# Patient Record
Sex: Female | Born: 1980 | Race: White | Hispanic: No | Marital: Single | Smoking: Former smoker
Health system: Southern US, Community
[De-identification: ages and names within clinical notes are randomized; demographics above are authoritative.]

## PROBLEM LIST (undated history)

## (undated) DIAGNOSIS — N946 Dysmenorrhea, unspecified: Secondary | ICD-10-CM

---

## 2006-08-20 ENCOUNTER — Emergency Department (HOSPITAL_COMMUNITY): Admission: EM | Admit: 2006-08-20 | Discharge: 2006-08-20 | Payer: Self-pay | Admitting: Family Medicine

## 2007-09-14 ENCOUNTER — Emergency Department (HOSPITAL_COMMUNITY): Admission: EM | Admit: 2007-09-14 | Discharge: 2007-09-14 | Payer: Self-pay | Admitting: Family Medicine

## 2008-03-07 ENCOUNTER — Emergency Department (HOSPITAL_COMMUNITY): Admission: EM | Admit: 2008-03-07 | Discharge: 2008-03-07 | Payer: Self-pay | Admitting: Emergency Medicine

## 2008-11-29 ENCOUNTER — Encounter (INDEPENDENT_AMBULATORY_CARE_PROVIDER_SITE_OTHER): Payer: Self-pay | Admitting: General Surgery

## 2008-11-29 ENCOUNTER — Ambulatory Visit (HOSPITAL_COMMUNITY): Admission: RE | Admit: 2008-11-29 | Discharge: 2008-11-29 | Payer: Self-pay | Admitting: General Surgery

## 2010-06-21 IMAGING — RF DG CHOLANGIOGRAM OPERATIVE
1 series · 4 of 4 positions shown · non-contrast
Comparison: None

CLINICAL DATA: Gallstones

INTRAOPERATIVE CHOLANGIOGRAM
TECHNIQUE: Multiple fluoroscopic spot radiographs were obtained
during intraoperative cholangiogram and are submitted for
interpretation post-operatively.
Fluoroscopy Time: 0.2 minutes

[Series 1: run · 4 of 28 frames shown]
[frame 1/28]
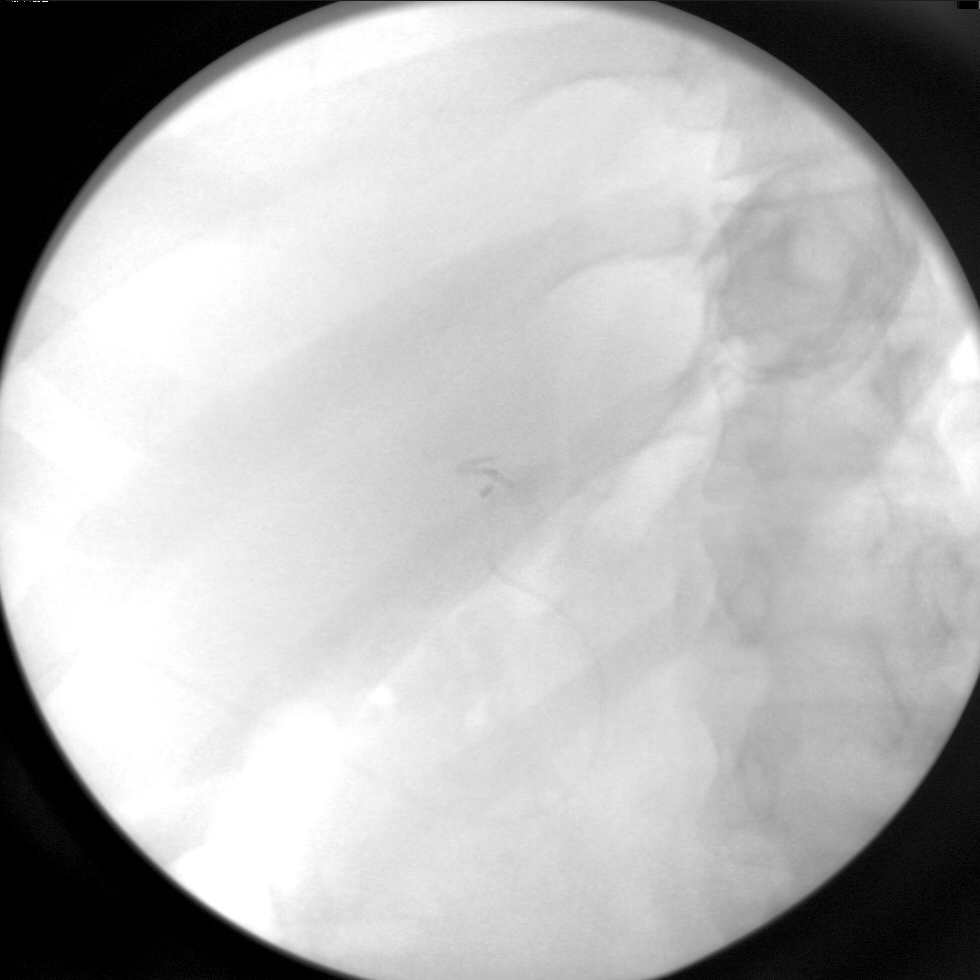
[frame 5/28]
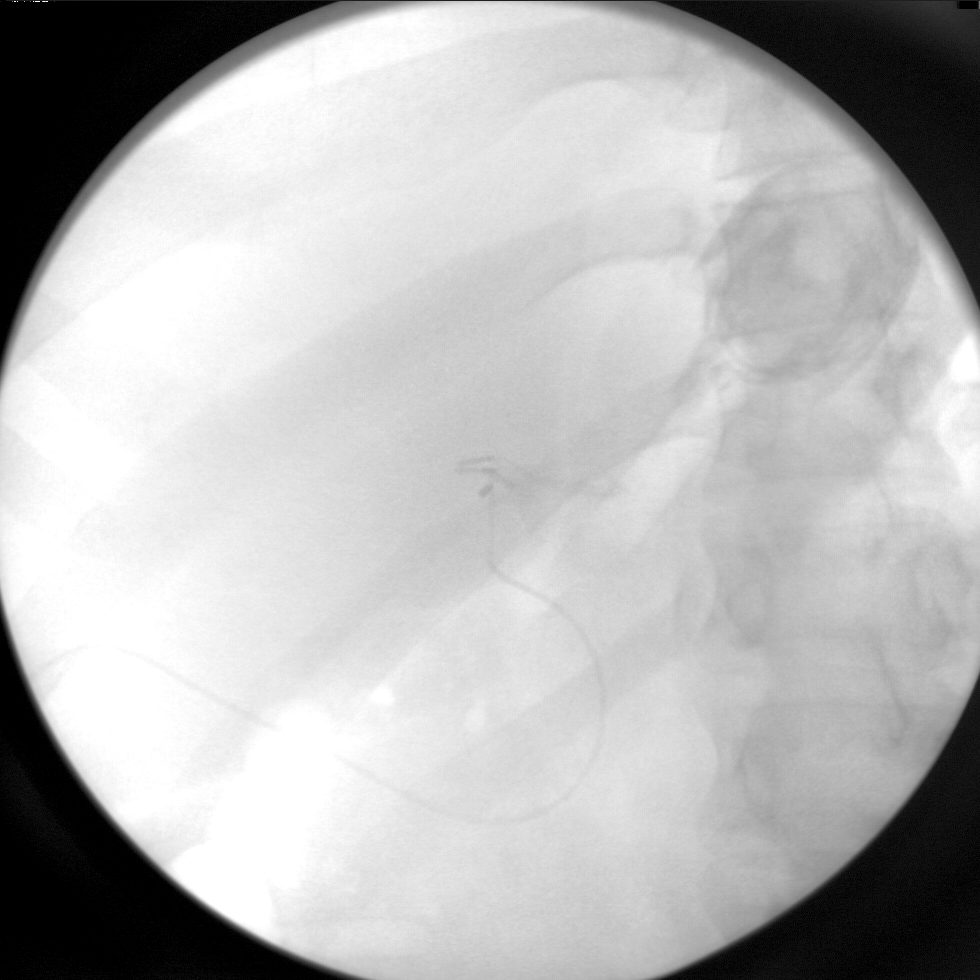
[frame 15/28]
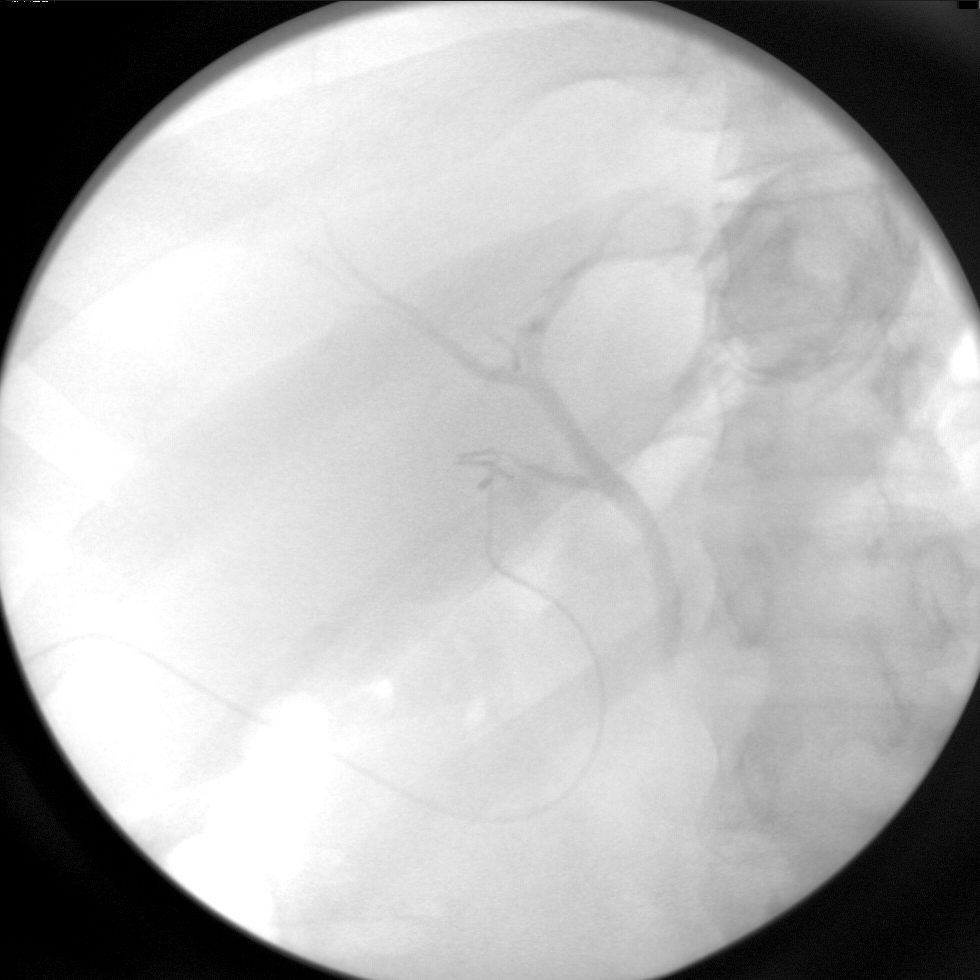
[frame 24/28]
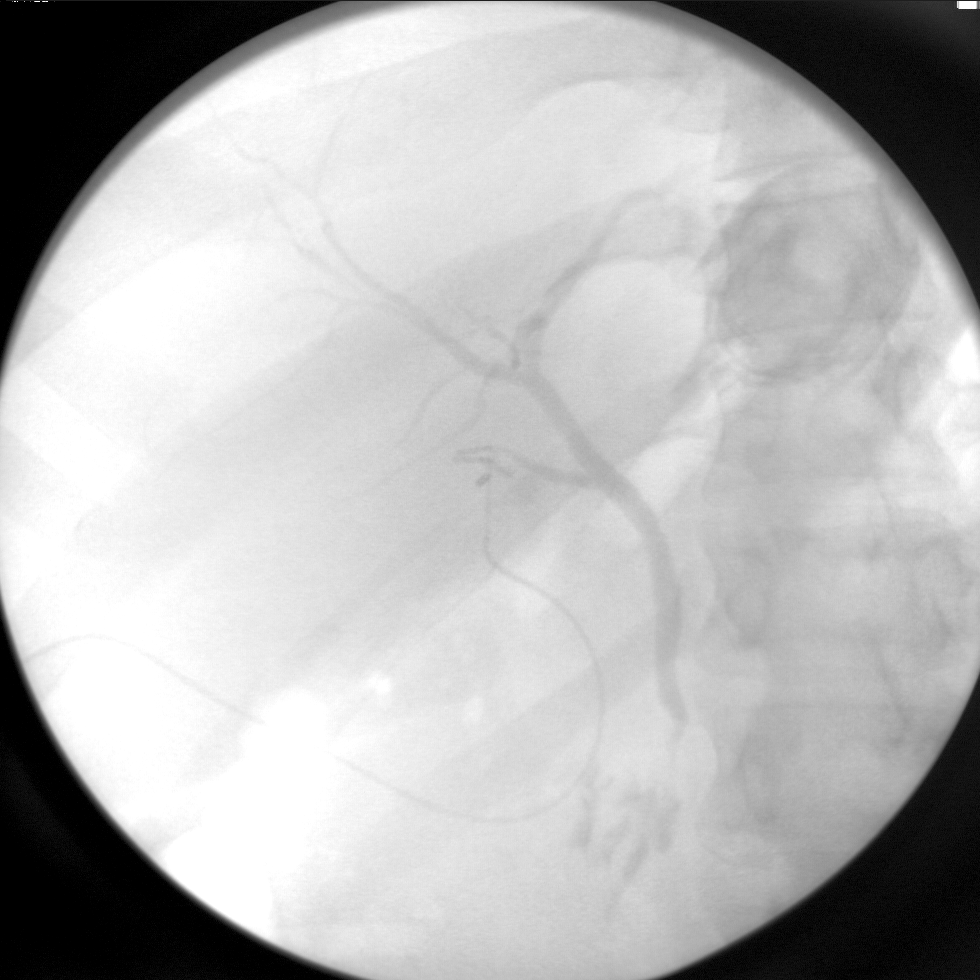

[4 of 4 positions shown; findings below may reference images not displayed]

FINDINGS: Contrast injection of the cystic duct.  No biliary ductal
stricture, dilatation, or filling defects to suggest calculi.
Contrast passes into the duodenum.
IMPRESSION: Negative operative cholangiogram.

## 2010-07-29 HISTORY — PX: LAPAROSCOPIC CHOLECYSTECTOMY: SUR755

## 2010-11-06 LAB — DIFFERENTIAL
Eosinophils Relative: 1 % (ref 0–5)
Lymphocytes Relative: 34 % (ref 12–46)
Lymphs Abs: 2.6 10*3/uL (ref 0.7–4.0)
Monocytes Absolute: 0.3 10*3/uL (ref 0.1–1.0)
Monocytes Relative: 4 % (ref 3–12)

## 2010-11-06 LAB — CBC
MCHC: 34.4 g/dL (ref 30.0–36.0)
MCV: 89.8 fL (ref 78.0–100.0)
Platelets: 224 10*3/uL (ref 150–400)
WBC: 7.7 10*3/uL (ref 4.0–10.5)

## 2010-11-06 LAB — COMPREHENSIVE METABOLIC PANEL
ALT: 23 U/L (ref 0–35)
AST: 14 U/L (ref 0–37)
Albumin: 3.8 g/dL (ref 3.5–5.2)
Calcium: 9.2 mg/dL (ref 8.4–10.5)
Creatinine, Ser: 0.69 mg/dL (ref 0.4–1.2)
GFR calc Af Amer: >60 mL/min (ref >60)
Sodium: 140 mEq/L (ref 135–145)
Total Protein: 6.7 g/dL (ref 6.0–8.3)

## 2010-11-06 LAB — PREGNANCY, URINE: Preg Test, Ur: NEGATIVE

## 2010-12-11 NOTE — Op Note (Signed)
[name], [name]               ACCOUNT NO.:  [id number]   MEDICAL RECORD NO.:  [id number]          PATIENT TYPE:  AMB   LOCATION:  DAY                          FACILITY:  [facility]   PHYSICIAN:  Anselm Pancoast. Weatherly, M.D.DATE OF BIRTH:  03-13-1981   DATE OF PROCEDURE:  11/29/2008  DATE OF DISCHARGE:                               OPERATIVE REPORT   PREOPERATIVE DIAGNOSIS:  Chronic cholecystitis with stones.   POSTOPERATIVE DIAGNOSIS:  Chronic cholecystitis with stones, subacute  gallbladder.   OPERATION:  Laparoscopic cholecystectomy with cholangiogram.   SURGEON:  Anselm Pancoast. Zachery Dakins, M.D.   ASSISTANT:  Adolph Pollack, M.D.   ANESTHESIA:  General anesthesia.   HISTORY:  [name] is a 30 year old Caucasian female who has been  trying to diet for weight control.  Weighs probably about 265 pounds and  she has been as heavy as 275.  I saw her in the office last week where  she had been referred by the [facility] physicians.  Elvera Lennox had  actually seen her.  She had had episodes of epigastric pain, sometimes  radiating to the back, and her mother has had gallstones within the last  year.  They did an ultrasound that showed stones throughout the  gallbladder.  They did not repeat laboratory studies, and she was  referred to our office and stated she had pain probably weeks before and  was not acutely tender but wanted to proceed on promptly with surgery.  She has got small stones, and her pain was certainly consistent with  passage of common duct stones and I was agreement with proceeding on as  soon as possible.  Her ultrasound had showed the common bile duct to be  only about 2 mm in size.  On her liver function studies today, liver  tests were normal.  Her white count and hemoglobin were normal also.  Preoperatively she was given Unison, not being allergic to penicillin  and has PAS stockings.   She was taken back to the operative suite.  Time out completed.  The  abdomen had been prepped with Betadine solution and draped before the  time out.  A small incision below the umbilicus was made using the deep  ends of the [employer] to identify the fascia.  A small little vertical  incision was made.  The two edges were picked up with Kochers and a  little opening made into the peritoneum and a Kelly introduced.  The  pursestring suture of 0 Vicryl was placed and the Hasson cannula  introduced.  She has got a subacutely inflamed gallbladder.  The upper  10-mm trocar was placed under direct vision.  It was anesthetized in the  fascia, and Dr. Abbey Chatters placed two lateral 5-mm trocars at the  appropriate position.  There were adhesions to the gallbladder.  These  were kind of carefully taken down.  It appears that they are fairly  recent.  Then the proximal portion of the gallbladder peritoneum was  opened.  You could see the cystic duct, and at is slightly prominent.  You could feel a little stone within  the cystic duct.  I placed a clip  on the junction of the cystic duct/gallbladder.  I made a little  incision right over the impacted stone.  You could milk it back into the  opening and then we could get good flow of bile.  I tried to put the  [facility] catheter in.  It appeared to leak.  I repositioned and inflated  down a little deeper and then held it in place with a clip.  The x-ray  was obtained.  Showed prompt filling of the extrahepatic biliary system,  good flow into the duodenum.  No evidence of a complex stone.  The  catheter was removed.  I went just a little bit more proximal on the  cystic duct, triply clipped it and divided it, and then the peritoneum  was kind of carefully opened up.  The anterior branch of the cystic  artery was doubly clipped proximally, singly distally and divided, and  then the posterior branch of the artery was double clipped also.  You  could see the little hepatic artery and we kind of dissected anterior,  kind of close  to the gallbladder wall and then freed up the gallbladder  completely.  There was a little bit of white bile spillage on the most  distal portion that was kind of adherent to the gallbladder fossa, and  then after the gallbladder was freed, the spatula electrocautery was  used for the little bit of hemostasis.  The gallbladder had been placed  into an EndoCatch bag and reinspected.  No evidence of any bleeding, and  then we switched the camera to the upper 10-mm port, grabbed the bag and  pulled it out the umbilicus.  The cystic duct clip was in good position.  There was no evidence of any bleeding, and then we had removed the  irrigating fluid and placed an extra pursestring suture at the fascia at  the umbilicus.  Marcaine was placed in the fascia of the umbilicus.  Subcutaneous wounds were closed with 4-0 Monocryl, benzoin and Steri-  Strips on the skin.  The patient wants to be discharged after the  recovery room, and she should do fine.      Anselm Pancoast. Zachery Dakins, M.D.  Electronically Signed     WJW/MEDQ  D:  11/29/2008  T:  11/29/2008  Job:  [id number]   cc:   Holley Bouche, M.D.  Fax: [phone]

## 2011-04-19 LAB — POCT RAPID STREP A: Streptococcus, Group A Screen (Direct): POSITIVE — AB

## 2011-04-26 LAB — COMPREHENSIVE METABOLIC PANEL
ALT: 13
Alkaline Phosphatase: 50
CO2: 25
Chloride: 107
Glucose, Bld: 94
Potassium: 3.8
Sodium: 138
Total Bilirubin: 0.4
Total Protein: 6.5

## 2011-04-26 LAB — D-DIMER, QUANTITATIVE: D-Dimer, Quant: <0.22

## 2021-07-29 DIAGNOSIS — I10 Essential (primary) hypertension: Secondary | ICD-10-CM

## 2021-07-29 DIAGNOSIS — D649 Anemia, unspecified: Secondary | ICD-10-CM

## 2021-07-29 HISTORY — DX: Anemia, unspecified: D64.9

## 2021-07-29 HISTORY — DX: Essential (primary) hypertension: I10

## 2021-11-19 ENCOUNTER — Other Ambulatory Visit: Payer: Self-pay | Admitting: Family Medicine

## 2021-11-19 DIAGNOSIS — N946 Dysmenorrhea, unspecified: Secondary | ICD-10-CM

## 2021-12-04 ENCOUNTER — Ambulatory Visit
Admission: RE | Admit: 2021-12-04 | Discharge: 2021-12-04 | Disposition: A | Discharge status: Home / Self Care | Payer: Managed Care, Other (non HMO) | Source: Ambulatory Visit | Attending: Family Medicine | Admitting: Family Medicine

## 2021-12-04 DIAGNOSIS — N946 Dysmenorrhea, unspecified: Secondary | ICD-10-CM

## 2022-04-22 ENCOUNTER — Encounter (HOSPITAL_BASED_OUTPATIENT_CLINIC_OR_DEPARTMENT_OTHER): Payer: Self-pay | Admitting: Obstetrics and Gynecology

## 2022-04-22 ENCOUNTER — Other Ambulatory Visit: Payer: Self-pay

## 2022-04-22 NOTE — Progress Notes (Signed)
Spoke w/ via phone for pre-op interview---[facility] Lab needs dos----urine pregnancy               Lab results------05/10/22 lab appt for cbc, bmp, type & screen, EKG COVID test -----patient states asymptomatic no test needed Arrive at -------0530 on Monday, 05/13/22 NPO after MN NO Solid Food.  Clear liquids from MN until---0430 Med rec completed Medications to take morning of surgery -----Alonna Buckler (BCP) Diabetic medication -----n/a Patient instructed no nail polish to be worn day of surgery Patient instructed to bring photo id and insurance card day of surgery Patient aware to have Driver (ride ) / caregiver    for 24 hours after surgery - Maurine Minister or Cher Nakai Patient Special Instructions -----Extended / overnight stay instructions given. Pre-Op special Istructions -----Requested orders from Dr. Gaetano Net via Epic IB on 04/22/22 Patient verbalized understanding of instructions that were given at this phone interview. Patient denies shortness of breath, chest pain, fever, cough at this phone interview.

## 2022-04-22 NOTE — Progress Notes (Signed)
Your procedure is scheduled on Monday, 05/13/22.  Report to [facility].   Call this number if you have problems the morning of surgery  :[phone].   OUR ADDRESS IS [address].  WE ARE LOCATED IN [facility].  PLEASE BRING YOUR INSURANCE CARD AND PHOTO ID DAY OF SURGERY.  ONLY 2 PEOPLE ARE ALLOWED IN  WAITING  ROOM.                                      REMEMBER:  DO NOT EAT FOOD, CANDY GUM OR MINTS  AFTER MIDNIGHT THE NIGHT BEFORE YOUR SURGERY . YOU MAY HAVE CLEAR LIQUIDS FROM MIDNIGHT THE NIGHT BEFORE YOUR SURGERY UNTIL  4:30 AM. NO CLEAR LIQUIDS AFTER   4:30 AM DAY OF SURGERY.  YOU MAY  BRUSH YOUR TEETH MORNING OF SURGERY AND RINSE YOUR MOUTH OUT, NO CHEWING GUM CANDY OR MINTS.     CLEAR LIQUID DIET   Foods Allowed                                                                     Foods Excluded  Coffee and tea, regular and decaf                             liquids that you cannot  Plain Jell-O                                                                   see through such as: Fruit ices (not with fruit pulp)                                     milk, soups, orange juice  Plain  Popsicles                                    All solid food Carbonated beverages, regular and diet                                    Cranberry, grape and apple juices Sports drinks like Gatorade _____________________________________________________________________     TAKE THESE MEDICATIONS MORNING OF SURGERY: Incassia (birth control pill)    UP TO 4 VISITORS  MAY VISIT IN THE EXTENDED RECOVERY ROOM UNTIL 800 PM ONLY.  ONE  VISITOR AGE 51 AND OVER MAY SPEND THE NIGHT AND MUST BE IN EXTENDED RECOVERY ROOM NO LATER THAN 800 PM . YOUR DISCHARGE TIME AFTER YOU SPEND THE NIGHT IS 900 AM THE MORNING AFTER YOUR SURGERY.  YOU MAY PACK A SMALL OVERNIGHT BAG WITH TOILETRIES FOR YOUR  OVERNIGHT STAY IF YOU WISH.  YOUR PRESCRIPTION  MEDICATIONS WILL BE PROVIDED DURING [place].                                      DO NOT WEAR JEWERLY, MAKE UP. DO NOT WEAR LOTIONS, POWDERS, PERFUMES OR NAIL POLISH ON YOUR FINGERNAILS. TOENAIL POLISH IS OK TO WEAR. DO NOT SHAVE FOR 48 HOURS PRIOR TO DAY OF SURGERY. MEN MAY SHAVE FACE AND NECK. CONTACTS, GLASSES, OR DENTURES MAY NOT BE WORN TO SURGERY.  REMEMBER: NO SMOKING, DRUGS OR ALCOHOL FOR 24 HOURS BEFORE YOUR SURGERY.                                    [facility] IS NOT RESPONSIBLE  FOR ANY BELONGINGS.                                                                    Marland Kitchen           [facility] - Preparing for Surgery Before surgery, you can play an important role.  Because skin is not sterile, your skin needs to be as free of germs as possible.  You can reduce the number of germs on your skin by washing with CHG (chlorahexidine gluconate) soap before surgery.  CHG is an antiseptic cleaner which kills germs and bonds with the skin to continue killing germs even after washing. Please DO NOT use if you have an allergy to CHG or antibacterial soaps.  If your skin becomes reddened/irritated stop using the CHG and inform your nurse when you arrive at Short Stay. Do not shave (including legs and underarms) for at least 48 hours prior to the first CHG shower.  You may shave your face/neck. Please follow these instructions carefully:  1.  Shower with CHG Soap the night before surgery and the  morning of Surgery.  2.  If you choose to wash your hair, wash your hair first as usual with your  normal  shampoo.  3.  After you shampoo, rinse your hair and body thoroughly to remove the  shampoo.                                        4.  Use CHG as you would any other liquid soap.  You can apply chg directly  to the skin and wash , chg soap provided, night before and morning of your surgery.  5.  Apply the CHG Soap to your body ONLY FROM THE NECK DOWN.   Do not use on face/ open                            Wound or open sores. Avoid contact with eyes, ears mouth and genitals (private parts).                       Wash face,  Genitals (private parts) with your normal soap.  6.  Wash thoroughly, paying special attention to the area where your surgery  will be performed.  7.  Thoroughly rinse your body with warm water from the neck down.  8.  DO NOT shower/wash with your normal soap after using and rinsing off  the CHG Soap.             9.  Pat yourself dry with a clean towel.            10.  Wear clean pajamas.            11.  Place clean sheets on your bed the night of your first shower and do not  sleep with pets. Day of Surgery : Do not apply any lotions/deodorants the morning of surgery.  Please wear clean clothes to the hospital/surgery center.  IF YOU HAVE ANY SKIN IRRITATION OR PROBLEMS WITH THE SURGICAL SOAP, PLEASE GET A BAR OF GOLD DIAL SOAP AND SHOWER THE NIGHT BEFORE YOUR SURGERY AND THE MORNING OF YOUR SURGERY. PLEASE LET THE NURSE KNOW MORNING OF YOUR SURGERY IF YOU HAD ANY PROBLEMS WITH THE SURGICAL SOAP.   ________________________________________________________________________                                                        QUESTIONS [facility] PRE OP NURSE PHONE [phone].

## 2022-05-03 NOTE — H&P (Signed)
[name] is an 41 y.o. female G2P1 with fibroids and heavy menstrual flows. Menses last 7-8 days and she cannot leave house for 2-3 days/month due to heavy flow, sometimes off of her feet. A trial of OCP did not help. U/S in office notes uterus 11.9 x 7.6 x 8.6 cm with at least 3 fibroids ranging 2.6 to 3.7 cm. Ovaries appear normal. Endometrial biopsy done.   Pertinent Gynecological History: Menses: flow is excessive with use of many pads or tampons on heaviest days Bleeding:  Contraception:  progestin only OCP DES exposure: denies Blood transfusions: none Sexually transmitted diseases: no past history Previous GYN Procedures:  Last mammogram: normal Date: unknown Last pap: normal Date: 2023 OB History: G2, P1   Menstrual History: Menarche age: unknown Patient's last menstrual period was 04/17/2022 (exact date).    Past Medical History:  Diagnosis Date   Anemia 2023   taking iron supplements   Dysmenorrhea    Hypertension 2023   Follows with Dr. Rachell Cipro, per pt LOV was in June or July of 2023    Past Surgical History:  Procedure Laterality Date   CESAREAN SECTION  2002   LAPAROSCOPIC CHOLECYSTECTOMY  2012    History reviewed. No pertinent family history.  Social History:  reports that she quit smoking about 2 years ago. Her smoking use included cigarettes. She has a 10.00 pack-year smoking history. She has never used smokeless tobacco. She reports current alcohol use. She reports that she does not use drugs.  Allergies: No Known Allergies  No medications prior to admission.    Review of Systems  Constitutional:  Negative for fever.    Height '5\' 5"'$  (1.651 m), weight 120.2 kg, last menstrual period 04/17/2022. Physical Exam Cardiovascular:     Rate and Rhythm: Normal rate.  Pulmonary:     Effort: Pulmonary effort is normal.     No results found for this or any previous visit (from the past 24 hour(s)).  No results found.  Assessment/Plan: 41 yo  with uterine fibroids and menorrhagia Total abdominal hysterectomy with bilateral salpingectomy discussed with patient. Will leave normal appearing ovaries Surgery and risks including infection, organ damage, bleeding/transfusion-HIV/Hep, DVT/PE, pneumonia, wound breakdown D/W patient. D/W possible wound vac due to Hx of wound breakdown after C/S.  Shon Millet II 05/03/2022, 12:47 PM

## 2022-05-10 ENCOUNTER — Encounter (HOSPITAL_COMMUNITY)
Admission: RE | Admit: 2022-05-10 | Discharge: 2022-05-10 | Disposition: A | Discharge status: Home / Self Care | Payer: Managed Care, Other (non HMO) | Source: Ambulatory Visit | Attending: Obstetrics and Gynecology

## 2022-05-10 DIAGNOSIS — Z01818 Encounter for other preprocedural examination: Secondary | ICD-10-CM | POA: Insufficient documentation

## 2022-05-10 LAB — CBC
HCT: 44.3 % (ref 36.0–46.0)
Hemoglobin: 14.4 g/dL (ref 12.0–15.0)
MCH: 30.4 pg (ref 26.0–34.0)
MCHC: 32.5 g/dL (ref 30.0–36.0)
MCV: 93.7 fL (ref 80.0–100.0)
Platelets: 268 10*3/uL (ref 150–400)
RBC: 4.73 MIL/uL (ref 3.87–5.11)
RDW: 13.2 % (ref 11.5–15.5)
WBC: 8.1 10*3/uL (ref 4.0–10.5)
nRBC: 0 % (ref 0.0–0.2)

## 2022-05-10 LAB — BASIC METABOLIC PANEL
Anion gap: 6 (ref 5–15)
BUN: 16 mg/dL (ref 6–20)
CO2: 24 mmol/L (ref 22–32)
Calcium: 9.1 mg/dL (ref 8.9–10.3)
Chloride: 107 mmol/L (ref 98–111)
Creatinine, Ser: 0.7 mg/dL (ref 0.44–1.00)
GFR, Estimated: >60 mL/min (ref >60)
Glucose, Bld: 108 mg/dL — ABNORMAL HIGH (ref 70–99)
Potassium: 3.9 mmol/L (ref 3.5–5.1)
Sodium: 137 mmol/L (ref 135–145)

## 2022-05-10 LAB — RPR: RPR Ser Ql: NONREACTIVE

## 2022-05-13 ENCOUNTER — Inpatient Hospital Stay (HOSPITAL_BASED_OUTPATIENT_CLINIC_OR_DEPARTMENT_OTHER)
Admission: AD | Admit: 2022-05-13 | Discharge: 2022-05-14 | DRG: 742 | Disposition: A | Discharge status: Home / Self Care | Payer: Managed Care, Other (non HMO) | Attending: Obstetrics and Gynecology | Admitting: Obstetrics and Gynecology

## 2022-05-13 ENCOUNTER — Observation Stay (HOSPITAL_BASED_OUTPATIENT_CLINIC_OR_DEPARTMENT_OTHER): Payer: Managed Care, Other (non HMO) | Admitting: Certified Registered Nurse Anesthetist

## 2022-05-13 ENCOUNTER — Encounter (HOSPITAL_BASED_OUTPATIENT_CLINIC_OR_DEPARTMENT_OTHER)
Admission: AD | Disposition: A | Discharge status: Home / Self Care | Payer: Self-pay | Source: Home / Self Care | Attending: Obstetrics and Gynecology

## 2022-05-13 ENCOUNTER — Encounter (HOSPITAL_BASED_OUTPATIENT_CLINIC_OR_DEPARTMENT_OTHER): Payer: Self-pay | Admitting: Obstetrics and Gynecology

## 2022-05-13 ENCOUNTER — Other Ambulatory Visit: Payer: Self-pay

## 2022-05-13 DIAGNOSIS — D251 Intramural leiomyoma of uterus: Secondary | ICD-10-CM | POA: Diagnosis present

## 2022-05-13 DIAGNOSIS — I10 Essential (primary) hypertension: Secondary | ICD-10-CM | POA: Diagnosis present

## 2022-05-13 DIAGNOSIS — Z6841 Body Mass Index (BMI) 40.0 and over, adult: Secondary | ICD-10-CM

## 2022-05-13 DIAGNOSIS — Z87891 Personal history of nicotine dependence: Secondary | ICD-10-CM | POA: Diagnosis not present

## 2022-05-13 DIAGNOSIS — N920 Excessive and frequent menstruation with regular cycle: Principal | ICD-10-CM | POA: Diagnosis present

## 2022-05-13 DIAGNOSIS — D219 Benign neoplasm of connective and other soft tissue, unspecified: Secondary | ICD-10-CM | POA: Diagnosis present

## 2022-05-13 DIAGNOSIS — Z01818 Encounter for other preprocedural examination: Principal | ICD-10-CM

## 2022-05-13 HISTORY — DX: Dysmenorrhea, unspecified: N94.6

## 2022-05-13 HISTORY — PX: HYSTERECTOMY ABDOMINAL WITH SALPINGECTOMY: SHX6725

## 2022-05-13 LAB — TYPE AND SCREEN
ABO/RH(D): O POS
Antibody Screen: NEGATIVE

## 2022-05-13 LAB — POCT PREGNANCY, URINE: Preg Test, Ur: NEGATIVE

## 2022-05-13 LAB — ABO/RH: ABO/RH(D): O POS

## 2022-05-13 SURGERY — HYSTERECTOMY, TOTAL, ABDOMINAL, WITH SALPINGECTOMY
Anesthesia: General | Site: Abdomen | Laterality: Bilateral

## 2022-05-13 MED ORDER — ACETAMINOPHEN 10 MG/ML IV SOLN
INTRAVENOUS | Status: DC | PRN
  Administered 2022-05-13: 1000 mg via INTRAVENOUS

## 2022-05-13 MED ORDER — OXYCODONE HCL 5 MG PO TABS
ORAL_TABLET | ORAL | Status: AC
  Filled 2022-05-13: qty 1

## 2022-05-13 MED ORDER — PROPOFOL 10 MG/ML IV BOLUS
INTRAVENOUS | Status: AC
  Filled 2022-05-13: qty 20

## 2022-05-13 MED ORDER — SENNA 8.6 MG PO TABS
1.0000 | ORAL_TABLET | Freq: Two times a day (BID) | ORAL | Status: DC
  Administered 2022-05-13: 8.6 mg via ORAL

## 2022-05-13 MED ORDER — KETAMINE HCL 50 MG/5ML IJ SOSY
PREFILLED_SYRINGE | INTRAMUSCULAR | Status: AC
  Filled 2022-05-13: qty 5

## 2022-05-13 MED ORDER — IBUPROFEN 200 MG PO TABS
ORAL_TABLET | ORAL | Status: AC
  Filled 2022-05-13: qty 3

## 2022-05-13 MED ORDER — LIDOCAINE HCL (PF) 2 % IJ SOLN
INTRAMUSCULAR | Status: DC | PRN
  Administered 2022-05-13: 1.5 mg/kg/h via INTRADERMAL

## 2022-05-13 MED ORDER — HYDROMORPHONE HCL 1 MG/ML IJ SOLN
INTRAMUSCULAR | Status: AC
  Filled 2022-05-13: qty 1

## 2022-05-13 MED ORDER — ACETAMINOPHEN 500 MG PO TABS
1000.0000 mg | ORAL_TABLET | Freq: Four times a day (QID) | ORAL | Status: DC
  Administered 2022-05-13 – 2022-05-14 (×3): 1000 mg via ORAL

## 2022-05-13 MED ORDER — MEPERIDINE HCL 25 MG/ML IJ SOLN: 6.2500 mg | INTRAMUSCULAR | Status: DC | PRN

## 2022-05-13 MED ORDER — OXYCODONE HCL 5 MG PO TABS
5.0000 mg | ORAL_TABLET | ORAL | Status: DC | PRN
  Administered 2022-05-13: 5 mg via ORAL
  Administered 2022-05-13: 10 mg via ORAL
  Administered 2022-05-13: 5 mg via ORAL
  Administered 2022-05-14: 10 mg via ORAL
  Administered 2022-05-14 (×2): 5 mg via ORAL

## 2022-05-13 MED ORDER — ROCURONIUM BROMIDE 10 MG/ML (PF) SYRINGE
PREFILLED_SYRINGE | INTRAVENOUS | Status: DC | PRN
  Administered 2022-05-13: 100 mg via INTRAVENOUS

## 2022-05-13 MED ORDER — ALUM & MAG HYDROXIDE-SIMETH 200-200-20 MG/5ML PO SUSP: 30.0000 mL | ORAL | Status: DC | PRN

## 2022-05-13 MED ORDER — SENNA 8.6 MG PO TABS
ORAL_TABLET | ORAL | Status: AC
  Filled 2022-05-13: qty 1

## 2022-05-13 MED ORDER — HYDROMORPHONE HCL 1 MG/ML IJ SOLN
INTRAMUSCULAR | Status: DC | PRN
  Administered 2022-05-13: 1 mg via INTRAVENOUS

## 2022-05-13 MED ORDER — POVIDONE-IODINE 10 % EX SWAB: 2.0000 | Freq: Once | CUTANEOUS | Status: DC

## 2022-05-13 MED ORDER — KETOROLAC TROMETHAMINE 30 MG/ML IJ SOLN
INTRAMUSCULAR | Status: DC | PRN
  Administered 2022-05-13: 30 mg via INTRAVENOUS

## 2022-05-13 MED ORDER — LACTATED RINGERS IV SOLN: INTRAVENOUS | Status: DC

## 2022-05-13 MED ORDER — BUPIVACAINE LIPOSOME 1.3 % IJ SUSP
INTRAMUSCULAR | Status: AC
  Filled 2022-05-13: qty 20

## 2022-05-13 MED ORDER — KETAMINE HCL 10 MG/ML IJ SOLN
INTRAMUSCULAR | Status: DC | PRN
  Administered 2022-05-13: 20 mg via INTRAVENOUS
  Administered 2022-05-13: 10 mg via INTRAVENOUS

## 2022-05-13 MED ORDER — HYDROMORPHONE HCL 1 MG/ML IJ SOLN: 0.2000 mg | INTRAMUSCULAR | Status: DC | PRN

## 2022-05-13 MED ORDER — MIDAZOLAM HCL 2 MG/2ML IJ SOLN
INTRAMUSCULAR | Status: AC
  Filled 2022-05-13: qty 2

## 2022-05-13 MED ORDER — HYDROMORPHONE HCL 2 MG/ML IJ SOLN
INTRAMUSCULAR | Status: AC
  Filled 2022-05-13: qty 1

## 2022-05-13 MED ORDER — LIDOCAINE 2% (20 MG/ML) 5 ML SYRINGE
INTRAMUSCULAR | Status: DC | PRN
  Administered 2022-05-13: 100 mg via INTRAVENOUS

## 2022-05-13 MED ORDER — ACETAMINOPHEN 500 MG PO TABS
ORAL_TABLET | ORAL | Status: AC
  Filled 2022-05-13: qty 2

## 2022-05-13 MED ORDER — PROPOFOL 10 MG/ML IV BOLUS
INTRAVENOUS | Status: DC | PRN
  Administered 2022-05-13: 200 mg via INTRAVENOUS

## 2022-05-13 MED ORDER — FENTANYL CITRATE (PF) 250 MCG/5ML IJ SOLN
INTRAMUSCULAR | Status: AC
  Filled 2022-05-13: qty 5

## 2022-05-13 MED ORDER — OXYCODONE HCL 5 MG PO TABS: 5.0000 mg | ORAL_TABLET | Freq: Once | ORAL | Status: DC | PRN

## 2022-05-13 MED ORDER — ONDANSETRON HCL 4 MG PO TABS: 4.0000 mg | ORAL_TABLET | Freq: Four times a day (QID) | ORAL | Status: DC | PRN

## 2022-05-13 MED ORDER — DIPHENHYDRAMINE HCL 50 MG/ML IJ SOLN
INTRAMUSCULAR | Status: DC | PRN
  Administered 2022-05-13: 12.5 mg via INTRAVENOUS

## 2022-05-13 MED ORDER — HYDROMORPHONE HCL 1 MG/ML IJ SOLN
0.2500 mg | INTRAMUSCULAR | Status: DC | PRN
  Administered 2022-05-13: 0.25 mg via INTRAVENOUS

## 2022-05-13 MED ORDER — BUPIVACAINE LIPOSOME 1.3 % IJ SUSP
INTRAMUSCULAR | Status: DC | PRN
  Administered 2022-05-13: 20 mL

## 2022-05-13 MED ORDER — BUPIVACAINE HCL (PF) 0.5 % IJ SOLN
INTRAMUSCULAR | Status: AC
  Filled 2022-05-13: qty 30

## 2022-05-13 MED ORDER — AMISULPRIDE (ANTIEMETIC) 5 MG/2ML IV SOLN: 10.0000 mg | Freq: Once | INTRAVENOUS | Status: DC | PRN

## 2022-05-13 MED ORDER — MIDAZOLAM HCL 2 MG/2ML IJ SOLN
INTRAMUSCULAR | Status: DC | PRN
  Administered 2022-05-13: 2 mg via INTRAVENOUS

## 2022-05-13 MED ORDER — MAGNESIUM HYDROXIDE 400 MG/5ML PO SUSP: 30.0000 mL | Freq: Every day | ORAL | Status: DC | PRN

## 2022-05-13 MED ORDER — SOD CITRATE-CITRIC ACID 500-334 MG/5ML PO SOLN: 30.0000 mL | ORAL | Status: DC

## 2022-05-13 MED ORDER — PROMETHAZINE HCL 25 MG/ML IJ SOLN: 6.2500 mg | INTRAMUSCULAR | Status: DC | PRN

## 2022-05-13 MED ORDER — CEFAZOLIN IN SODIUM CHLORIDE 3-0.9 GM/100ML-% IV SOLN
3.0000 g | INTRAVENOUS | Status: AC
  Administered 2022-05-13: 3 g via INTRAVENOUS

## 2022-05-13 MED ORDER — ONDANSETRON HCL 4 MG/2ML IJ SOLN
INTRAMUSCULAR | Status: DC | PRN
  Administered 2022-05-13: 4 mg via INTRAVENOUS

## 2022-05-13 MED ORDER — DEXAMETHASONE SODIUM PHOSPHATE 10 MG/ML IJ SOLN
INTRAMUSCULAR | Status: DC | PRN
  Administered 2022-05-13: 10 mg via INTRAVENOUS

## 2022-05-13 MED ORDER — BUPIVACAINE HCL 0.5 % IJ SOLN
INTRAMUSCULAR | Status: DC | PRN
  Administered 2022-05-13: 20 mL

## 2022-05-13 MED ORDER — OXYCODONE HCL 5 MG/5ML PO SOLN: 5.0000 mg | Freq: Once | ORAL | Status: DC | PRN

## 2022-05-13 MED ORDER — CEFAZOLIN IN SODIUM CHLORIDE 3-0.9 GM/100ML-% IV SOLN
INTRAVENOUS | Status: AC
  Filled 2022-05-13: qty 100

## 2022-05-13 MED ORDER — 0.9 % SODIUM CHLORIDE (POUR BTL) OPTIME
TOPICAL | Status: DC | PRN
  Administered 2022-05-13 (×2): 1000 mL

## 2022-05-13 MED ORDER — ONDANSETRON HCL 4 MG/2ML IJ SOLN: 4.0000 mg | Freq: Four times a day (QID) | INTRAMUSCULAR | Status: DC | PRN

## 2022-05-13 MED ORDER — IBUPROFEN 200 MG PO TABS
600.0000 mg | ORAL_TABLET | Freq: Four times a day (QID) | ORAL | Status: DC | PRN
  Administered 2022-05-13 – 2022-05-14 (×2): 600 mg via ORAL

## 2022-05-13 MED ORDER — FENTANYL CITRATE (PF) 250 MCG/5ML IJ SOLN
INTRAMUSCULAR | Status: DC | PRN
  Administered 2022-05-13 (×2): 50 ug via INTRAVENOUS
  Administered 2022-05-13 (×2): 25 ug via INTRAVENOUS
  Administered 2022-05-13: 50 ug via INTRAVENOUS

## 2022-05-13 MED ORDER — ACETAMINOPHEN 10 MG/ML IV SOLN
INTRAVENOUS | Status: AC
  Filled 2022-05-13: qty 100

## 2022-05-13 MED ORDER — SUGAMMADEX SODIUM 200 MG/2ML IV SOLN
INTRAVENOUS | Status: DC | PRN
  Administered 2022-05-13: 300 mg via INTRAVENOUS

## 2022-05-13 SURGICAL SUPPLY — 30 items
APL PRP STRL LF DISP 70% ISPRP (MISCELLANEOUS) ×1
BLADE EXTENDED COATED 6.5IN (ELECTRODE) IMPLANT
CHLORAPREP W/TINT 26 (MISCELLANEOUS) ×1 IMPLANT
DRAPE WARM FLUID 44X44 (DRAPES) ×1 IMPLANT
DRSG OPSITE POSTOP 4X10 (GAUZE/BANDAGES/DRESSINGS) ×1 IMPLANT
GLOVE BIO SURGEON STRL SZ8 (GLOVE) ×2 IMPLANT
GOWN STRL REUS W/TWL XL LVL3 (GOWN DISPOSABLE) ×1 IMPLANT
KIT DRSG PREVENA PLUS 7DAY 125 (MISCELLANEOUS) IMPLANT
KIT TURNOVER CYSTO (KITS) ×1 IMPLANT
LIGASURE IMPACT 36 18CM CVD LR (INSTRUMENTS) IMPLANT
NS IRRIG 500ML POUR BTL (IV SOLUTION) ×1 IMPLANT
PACK ABDOMINAL GYN (CUSTOM PROCEDURE TRAY) ×1 IMPLANT
PAD OB MATERNITY 4.3X12.25 (PERSONAL CARE ITEMS) ×1 IMPLANT
SPIKE FLUID TRANSFER (MISCELLANEOUS) IMPLANT
SPONGE T-LAP 18X18 ~~LOC~~+RFID (SPONGE) IMPLANT
STRIP CLOSURE SKIN 1/4X4 (GAUZE/BANDAGES/DRESSINGS) IMPLANT
SUT MNCRL 0 1X36 CT-1 (SUTURE) ×1 IMPLANT
SUT MNCRL 0 MO-4 VIOLET 18 CR (SUTURE) ×1 IMPLANT
SUT MNCRL 0 VIOLET 6X18 (SUTURE) ×1 IMPLANT
SUT MONOCRYL 0 (SUTURE) ×1
SUT MONOCRYL 0 6X18 (SUTURE) ×1
SUT MONOCRYL 0 MO 4 18  CR/8 (SUTURE) ×1
SUT PDS AB 0 CTX 60 (SUTURE) ×2 IMPLANT
SUT PLAIN 2 0 (SUTURE) ×1
SUT PLAIN ABS 2-0 CT1 27XMFL (SUTURE) ×1 IMPLANT
SUT VIC AB 4-0 KS 27 (SUTURE) ×1 IMPLANT
SYR BULB IRRIG 60ML STRL (SYRINGE) ×1 IMPLANT
TOWEL OR 17X26 10 PK STRL BLUE (TOWEL DISPOSABLE) ×1 IMPLANT
TRAY FOLEY W/BAG SLVR 14FR LF (SET/KITS/TRAYS/PACK) ×1 IMPLANT
WATER STERILE IRR 500ML POUR (IV SOLUTION) ×1 IMPLANT

## 2022-05-13 NOTE — Progress Notes (Signed)
05/13/2022  9:08 AM  PATIENT:  Quentin Mulling  41 y.o. female  PRE-OPERATIVE DIAGNOSIS:  AUB, FIBROIDS  POST-OPERATIVE DIAGNOSIS:  AUB, FIBROIDS  PROCEDURE:  Procedure(s): TOTAL HYSTERECTOMY ABDOMINAL WITH SALPINGECTOMY (Bilateral)  SURGEON:  Surgeon(s) and Role:    * Everlene Farrier, MD - Primary    * Molli Posey, MD - Assisting  PHYSICIAN ASSISTANT:   ASSISTANTS: none   ANESTHESIA:   general  EBL:  250 mL   BLOOD ADMINISTERED:none  DRAINS: Urinary Catheter (Foley)   LOCAL MEDICATIONS USED:  MARCAINE   , Amount: 20 ml, and OTHER Exparel 20 mo  SPECIMEN:  Source of Specimen:  uterus, bilateral tubes  DISPOSITION OF SPECIMEN:  PATHOLOGY  COUNTS:  YES  TOURNIQUET:  * No tourniquets in log *  DICTATION: .Other Dictation: Dictation Number [id number]  PLAN OF CARE: Admit for overnight observation  PATIENT DISPOSITION:  PACU - hemodynamically stable.   Delay start of Pharmacological VTE agent (>24hrs) due to surgical blood loss or risk of bleeding: not applicable

## 2022-05-13 NOTE — Progress Notes (Signed)
Good pain relief, ambulating well, passing flatus, tolerating regular diet  Today's Vitals   05/13/22 1400 05/13/22 1500 05/13/22 1547 05/13/22 1800  BP:   110/60   Pulse:   99   Resp:   16   Temp:   97.9 F (36.6 C)   TempSrc:   Oral   SpO2:   97%   Weight:      Height:      PainSc: 2  0-No pain 0-No pain 5    Body mass index is 48.26 kg/m.  Abdomen soft, Wound vac in place-small amount of clotted blood in vac that occurred after walking in hall, no further bleeding since then  A/P: stable         Leave wound vac in place and reevaluate in am

## 2022-05-13 NOTE — Transfer of Care (Signed)
Immediate Anesthesia Transfer of Care Note  Patient: [name]  Procedure(s) Performed: TOTAL HYSTERECTOMY ABDOMINAL WITH SALPINGECTOMY (Bilateral: Abdomen)  Patient Location: PACU  Anesthesia Type:General  Level of Consciousness: awake, alert  and oriented  Airway & Oxygen Therapy: Patient Spontanous Breathing  Post-op Assessment: Report given to RN and Post -op Vital signs reviewed and stable  Post vital signs: Reviewed and stable  Last Vitals:  Vitals Value Taken Time  BP 151/109 05/13/22 0922  Temp    Pulse 87 05/13/22 0928  Resp 13 05/13/22 0928  SpO2 94 % 05/13/22 0928  Vitals shown include unvalidated device data.  Last Pain:  Vitals:   05/13/22 0557  TempSrc: Oral  PainSc: 0-No pain      Patients Stated Pain Goal: 4 (50/03/70 4888)  Complications: No notable events documented.

## 2022-05-13 NOTE — Progress Notes (Signed)
No changes to H&P per patient history EMBx is benign Reviewed procedure-TAH/BS, remove ovary if abnormal NKDA All questions answered Patient states she understands and agrees

## 2022-05-13 NOTE — Op Note (Signed)
[name], [name] MEDICAL RECORD NO: [id number] ACCOUNT NO: [id number] DATE OF BIRTH: May 17, 1981 FACILITY: [facility] LOCATION: [facility] PHYSICIAN: Daleen Bo. Lyn Hollingshead, MD  Operative Report   DATE OF PROCEDURE: 05/13/2022   PREOPERATIVE DIAGNOSIS:  Uterine leiomyomata.  POSTOPERATIVE DIAGNOSIS:  Uterine leiomyomata.   PROCEDURE:  Total abdominal hysterectomy with bilateral salpingectomy.  SURGEON:  Everlene Farrier, MD  ASSISTANT:  Molli Posey, MD  ANESTHESIA:  General with endotracheal intubation, Candida Peeling, MD  SPECIMENS:  Uterus, bilateral fallopian tubes to pathology.  ESTIMATED BLOOD LOSS:  250 mL.  INDICATION AND CONSENT:  This patient is a 41 year old patient with uterine fibroids and heavy bleeding.  Details are dictated in history and physical.  Total abdominal hysterectomy, bilateral salpingectomy and removal of an ovary only if abnormal has  been discussed preoperatively.  Potential risks and complications have been discussed preoperatively including but not limited to infection, organ damage, bleeding requiring transfusion of blood products with HIV and hepatitis acquisition, DVT, PE,  pneumonia, wound breakdown.  The patient states she understands and agrees and consent is signed in on the chart.  FINDINGS:  Uterus is 12-14 weeks in size with multiple intramural leiomyomata.  Tubes and ovaries were normal bilaterally.  DESCRIPTION OF PROCEDURE:  The patient was taken to the operating room where she was identified, placed in the dorsal supine position and general anesthesia was induced via endotracheal intubation.  She was then prepped vaginally with Betadine.  Foley  catheter was placed and prepped abdominally with ChloraPrep.  Timeout was done.  After 3-minute drying time, she was draped in a sterile fashion.  Skin was entered through the Pfannenstiel scar and dissection was carried out in layers to the peritoneum,  which was entered and extended superiorly and  inferiorly.  Uterus was then delivered through the incision.  Then, using the LigaSure Impact cautery cutting device, the right fallopian tube was taken down along its entire length, coming across the  uteroovarian ligament, round ligament, down the level of vesicouterine peritoneum.  Bladder flap was developed.  Similar procedure was carried on the left side.  Then, using progressive bites the cardinal ligaments were taken down followed by the  vessels.  The bladder was further advanced sharply and bluntly.  The vagina was then entered bilaterally with curved clamps, taking the uterosacral ligaments and the remainder of the specimen was delivered and cut free. Inspection noted external cervical os removed with specimen. All pedicles are tied with 0  Monocryl unless otherwise designated.  Cuff was closed with figure-of-eights.  Angle pedicles are tied in the midline.  Lavage was carried out.  Hemostasis was obtained without difficulty.  Pack is removed and the anterior peritoneum was closed in  running fashion with 0 Monocryl suture, which was also used to reapproximate the pyramidalis muscle in the midline.  Anterior rectus fascia was closed in a running fashion with a 0 looped PDS.  20 mL of Exparel mixed with 20 mL of 0.5% plain Marcaine was  then instilled subfascially and subcutaneously across the entire incision using all 40 mL. Subcutaneous layer was closed with interrupted plain and the skin was closed in a subcuticular fashion with 4-0 Vicryl on a Keith needle.  Benzoin and  Steri-Strips were applied.  Vacuum dressing was then applied.  All counts were correct.  The patient was awakened and taken to recovery room in stable condition.     Elián.Darby D: 05/13/2022 9:15:21 am T: 05/13/2022 10:17:00 am  JOB: [id number]/ [id number]

## 2022-05-13 NOTE — Anesthesia Preprocedure Evaluation (Signed)
Anesthesia Evaluation  Patient identified by MRN, date of birth, ID band Patient awake    Reviewed: Allergy & Precautions, NPO status , Patient's Chart, lab work & pertinent test results  Airway Mallampati: II  TM Distance: >3 FB Neck ROM: Full    Dental no notable dental hx.    Pulmonary neg pulmonary ROS, former smoker,    Pulmonary exam normal breath sounds clear to auscultation       Cardiovascular hypertension, Pt. on medications negative cardio ROS Normal cardiovascular exam Rhythm:Regular Rate:Normal     Neuro/Psych negative neurological ROS  negative psych ROS   GI/Hepatic negative GI ROS, Neg liver ROS,   Endo/Other  Morbid obesity  Renal/GU negative Renal ROS  negative genitourinary   Musculoskeletal negative musculoskeletal ROS (+)   Abdominal (+) + obese,   Peds negative pediatric ROS (+)  Hematology negative hematology ROS (+)   Anesthesia Other Findings   Reproductive/Obstetrics negative OB ROS                             Anesthesia Physical Anesthesia Plan  ASA: 3  Anesthesia Plan: General   Post-op Pain Management: Ketamine IV*, Lidocaine infusion*, Dilaudid IV, Ofirmev IV (intra-op)* and Toradol IV (intra-op)*   Induction: Intravenous  PONV Risk Score and Plan: 3 and Ondansetron, Dexamethasone, Midazolam and Treatment may vary due to age or medical condition  Airway Management Planned: Oral ETT  Additional Equipment:   Intra-op Plan:   Post-operative Plan: Extubation in OR  Informed Consent: I have reviewed the patients History and Physical, chart, labs and discussed the procedure including the risks, benefits and alternatives for the proposed anesthesia with the patient or authorized representative who has indicated his/her understanding and acceptance.     Dental advisory given  Plan Discussed with: CRNA  Anesthesia Plan Comments:          Anesthesia Quick Evaluation

## 2022-05-13 NOTE — Anesthesia Procedure Notes (Signed)
Procedure Name: Intubation Date/Time: 05/13/2022 7:41 AM  Performed by: Clearnce Sorrel, CRNAPre-anesthesia Checklist: Patient identified, Emergency Drugs available, Suction available and Patient being monitored Patient Re-evaluated:Patient Re-evaluated prior to induction Oxygen Delivery Method: Circle System Utilized Preoxygenation: Pre-oxygenation with 100% oxygen Induction Type: IV induction Ventilation: Mask ventilation without difficulty Laryngoscope Size: Mac and 3 Grade View: Grade I Tube type: Oral Tube size: 7.0 mm Number of attempts: 1 Airway Equipment and Method: Stylet and Oral airway Placement Confirmation: ETT inserted through vocal cords under direct vision, positive ETCO2 and breath sounds checked- equal and bilateral Secured at: 23 cm Tube secured with: Tape Dental Injury: Teeth and Oropharynx as per pre-operative assessment

## 2022-05-13 NOTE — Anesthesia Postprocedure Evaluation (Signed)
Anesthesia Post Note  Patient: Engineer, production  Procedure(s) Performed: TOTAL HYSTERECTOMY ABDOMINAL WITH SALPINGECTOMY (Bilateral: Abdomen)     Patient location during evaluation: PACU Anesthesia Type: General Level of consciousness: awake and alert Pain management: pain level controlled Vital Signs Assessment: post-procedure vital signs reviewed and stable Respiratory status: spontaneous breathing, nonlabored ventilation and respiratory function stable Cardiovascular status: blood pressure returned to baseline and stable Postop Assessment: no apparent nausea or vomiting Anesthetic complications: no   No notable events documented.  Last Vitals:  Vitals:   05/13/22 1015 05/13/22 1029  BP: 121/82 127/77  Pulse: 89 91  Resp: 13 (!) 22  Temp:    SpO2: 93% (!) 88%    Last Pain:  Vitals:   05/13/22 1029  TempSrc:   PainSc: [place]

## 2022-05-14 ENCOUNTER — Encounter (HOSPITAL_BASED_OUTPATIENT_CLINIC_OR_DEPARTMENT_OTHER): Payer: Self-pay | Admitting: Obstetrics and Gynecology

## 2022-05-14 LAB — CBC
HCT: 29 % — ABNORMAL LOW (ref 36.0–46.0)
Hemoglobin: 9.4 g/dL — ABNORMAL LOW (ref 12.0–15.0)
MCH: 30.7 pg (ref 26.0–34.0)
MCHC: 32.4 g/dL (ref 30.0–36.0)
MCV: 94.8 fL (ref 80.0–100.0)
Platelets: 246 10*3/uL (ref 150–400)
RBC: 3.06 MIL/uL — ABNORMAL LOW (ref 3.87–5.11)
RDW: 13.3 % (ref 11.5–15.5)
WBC: 12.9 10*3/uL — ABNORMAL HIGH (ref 4.0–10.5)
nRBC: 0 % (ref 0.0–0.2)

## 2022-05-14 LAB — SURGICAL PATHOLOGY

## 2022-05-14 MED ORDER — ACETAMINOPHEN 500 MG PO TABS
ORAL_TABLET | ORAL | Status: AC
  Filled 2022-05-14: qty 2

## 2022-05-14 MED ORDER — OXYCODONE HCL 5 MG PO TABS
ORAL_TABLET | ORAL | Status: AC
  Filled 2022-05-14: qty 2

## 2022-05-14 MED ORDER — IBUPROFEN 200 MG PO TABS
ORAL_TABLET | ORAL | Status: AC
  Filled 2022-05-14: qty 3

## 2022-05-14 MED ORDER — OXYCODONE HCL 5 MG PO TABS: 5.0000 mg | ORAL_TABLET | Freq: Four times a day (QID) | ORAL | 0 refills | Status: AC | PRN

## 2022-05-14 MED ORDER — IBUPROFEN 600 MG PO TABS: 600.0000 mg | ORAL_TABLET | Freq: Four times a day (QID) | ORAL | 0 refills | Status: AC | PRN

## 2022-05-14 NOTE — Progress Notes (Signed)
POD #1  Feels good, ambulating, tolerating regular diet, good pain control  Today's Vitals   05/14/22 0015 05/14/22 0246 05/14/22 0430 05/14/22 0612  BP:  123/65  117/77  Pulse:  98  87  Resp:  16  16  Temp:  98.5 F (36.9 C)  98.2 F (36.8 C)  TempSrc:      SpO2:  94%  97%  Weight:      Height:      PainSc: Asleep 2  Asleep 5    Body mass index is 48.26 kg/m.   Abdomen soft, wound vac in place-no further blood  Results for orders placed or performed during the hospital encounter of 05/13/22 (from the past 24 hour(s))  CBC     Status: Abnormal   Collection Time: 05/14/22  6:10 AM  Result Value Ref Range   WBC 12.9 (H) 4.0 - 10.5 K/uL   RBC 3.06 (L) 3.87 - 5.11 MIL/uL   Hemoglobin 9.4 (L) 12.0 - 15.0 g/dL   HCT 29.0 (L) 36.0 - 46.0 %   MCV 94.8 80.0 - 100.0 fL   MCH 30.7 26.0 - 34.0 pg   MCHC 32.4 30.0 - 36.0 g/dL   RDW 13.3 11.5 - 15.5 %   Platelets 246 150 - 400 K/uL   nRBC 0.0 0.0 - 0.2 %    A/P: D/C home         Instructions given         FU office 3 days to remove wound vac

## 2022-05-14 NOTE — Discharge Summary (Signed)
Physician Discharge Summary  Patient ID: [name] MRN: [id number] DOB/AGE: March 08, 1981 41 y.o.  Admit date: 05/13/2022 Discharge date: 05/14/2022  Admission Diagnoses:Menorrhagia                                        Fibroids  Discharge Diagnoses:  Principal Problem:   Menorrhagia Active Problems:   Fibroids   Discharged Condition: good  Hospital Course: taken to OR for TAH/BS. Postoperatively had good resumption of bowel/bladder function. She is ambulating well with good pain control. Wound vac in place had small amount of blood last evening after initial walk in the hall but no further bleeding noted.  Consults: None  Significant Diagnostic Studies: labs:  Results for orders placed or performed during the hospital encounter of 05/13/22 (from the past 24 hour(s))  CBC     Status: Abnormal   Collection Time: 05/14/22  6:10 AM  Result Value Ref Range   WBC 12.9 (H) 4.0 - 10.5 K/uL   RBC 3.06 (L) 3.87 - 5.11 MIL/uL   Hemoglobin 9.4 (L) 12.0 - 15.0 g/dL   HCT 29.0 (L) 36.0 - 46.0 %   MCV 94.8 80.0 - 100.0 fL   MCH 30.7 26.0 - 34.0 pg   MCHC 32.4 30.0 - 36.0 g/dL   RDW 13.3 11.5 - 15.5 %   Platelets 246 150 - 400 K/uL   nRBC 0.0 0.0 - 0.2 %     Treatments: surgery: total abdominal hysterectomy with bilateral salpingectomy  Discharge Exam: Blood pressure 117/77, pulse 87, temperature 98.2 F (36.8 C), resp. rate 16, height '5\' 5"'$  (1.651 m), weight 131.5 kg, last menstrual period 04/17/2022, SpO2 97 %. General appearance: alert, cooperative, and no distress GI: soft, wound vac in place  Disposition: Discharge disposition: 01-Home or Self Care           Signed: Shon Millet II 05/14/2022, 7:53 AM

## 2023-06-26 IMAGING — US US PELVIS COMPLETE WITH TRANSVAGINAL
2 series · 13 of 25 positions shown · non-contrast
Comparison: None

CLINICAL DATA: Menorrhagia, dysmenorrhea, LMP 11/24/2021



[Series 1: us pelvis complete with transvaginal · 0.21mm/px · 12 of 126 slices shown (1 of 2)]
[im 1/126]
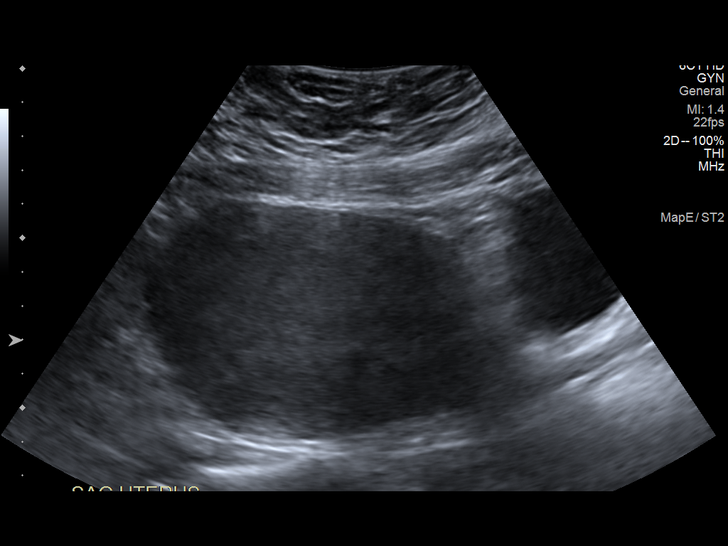
[im 11/126]
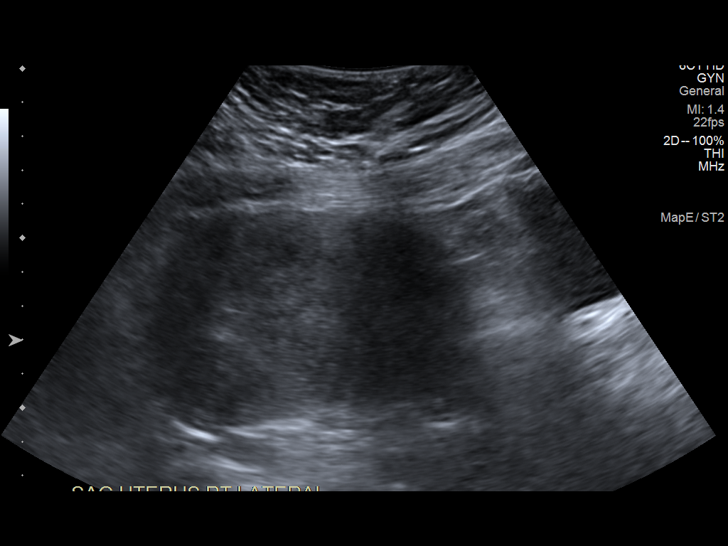
[im 22/126]
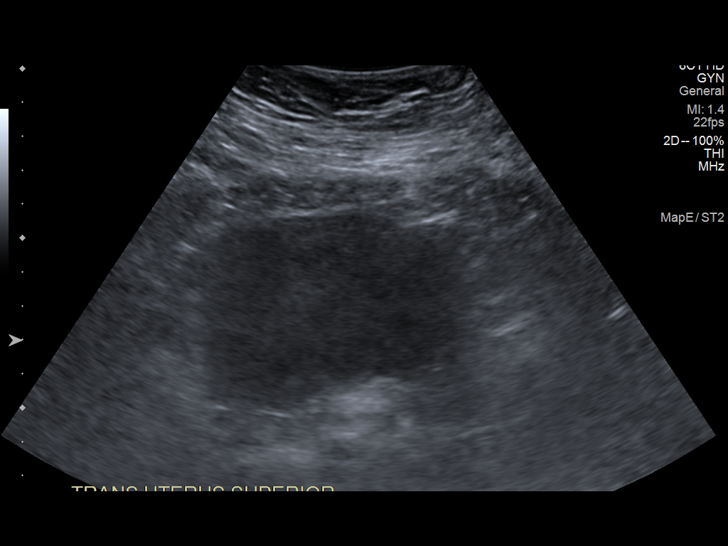
[im 33/126]
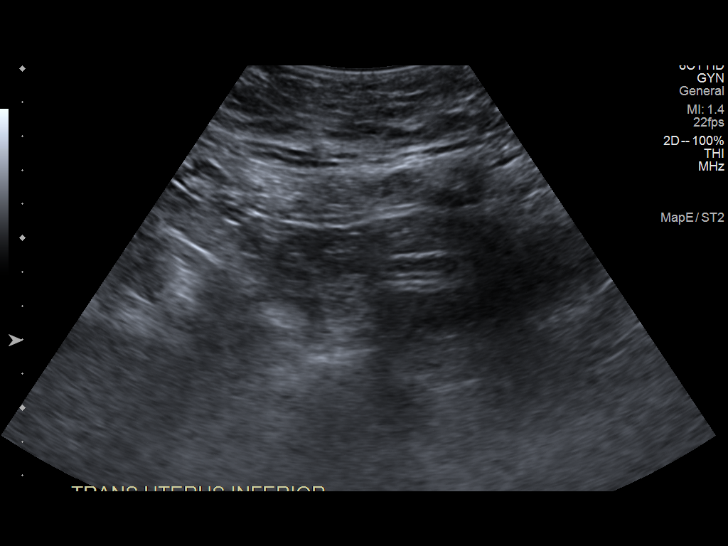
[im 44/126]
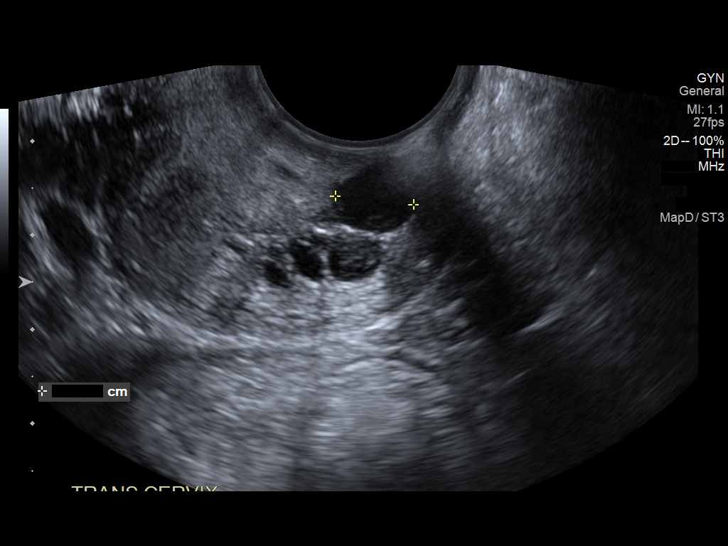
[im 55/126]
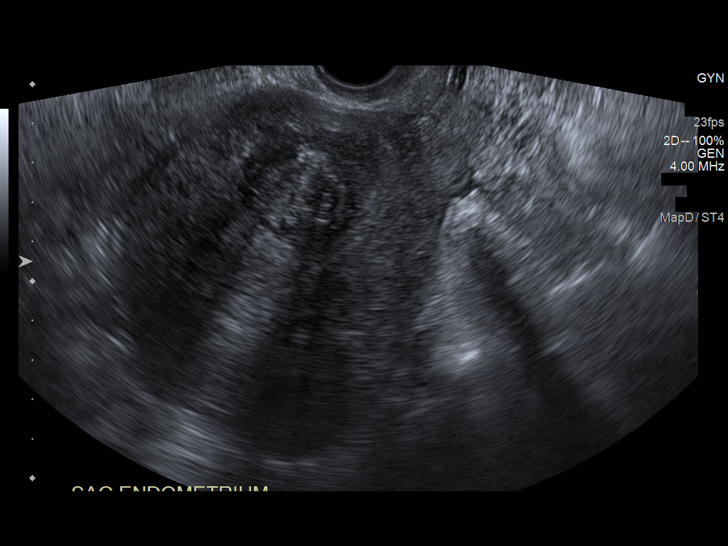
[im 66/126]
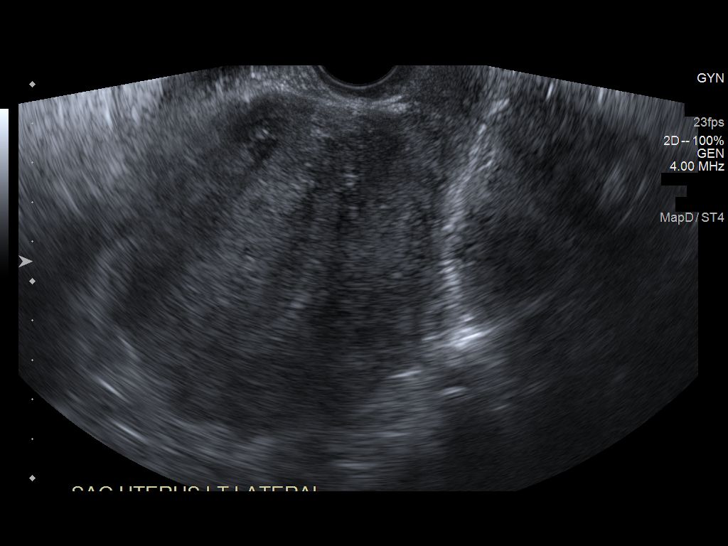
[im 77/126]
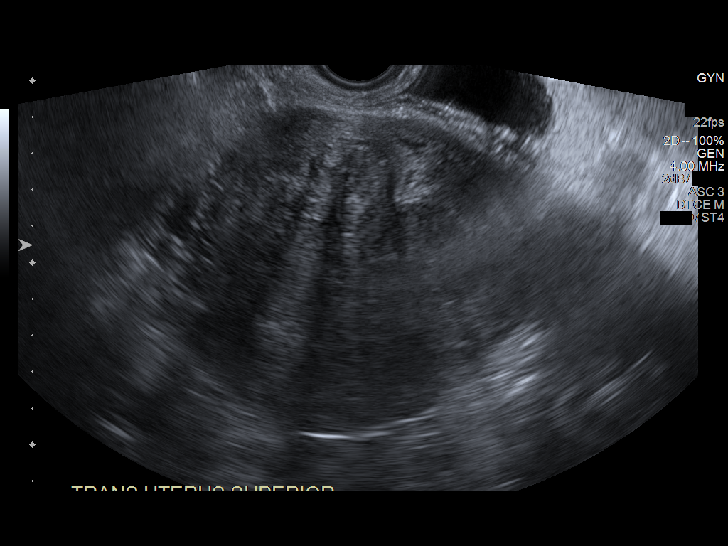
[im 87/126]
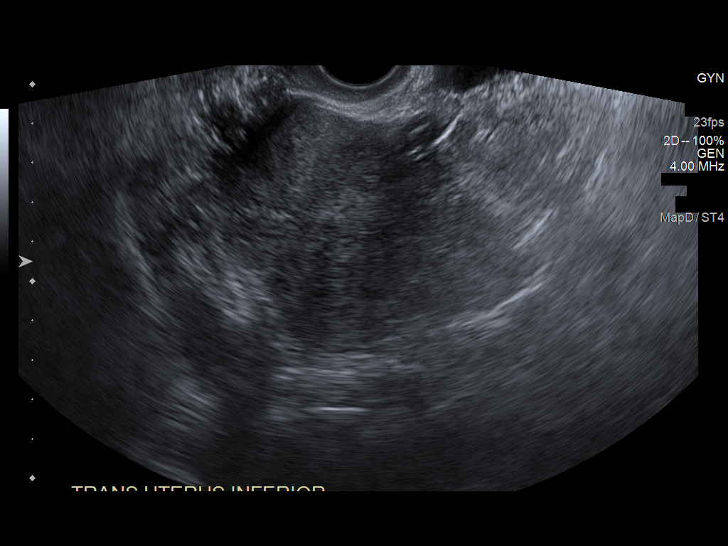
[im 98/126]
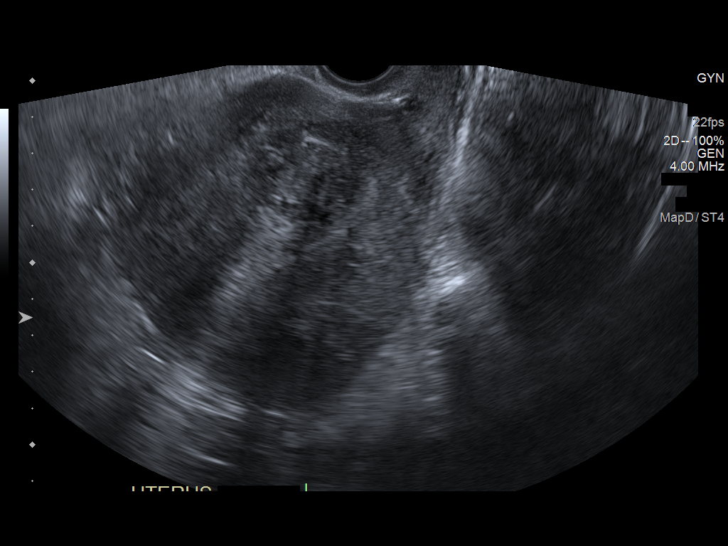
[im 109/126]
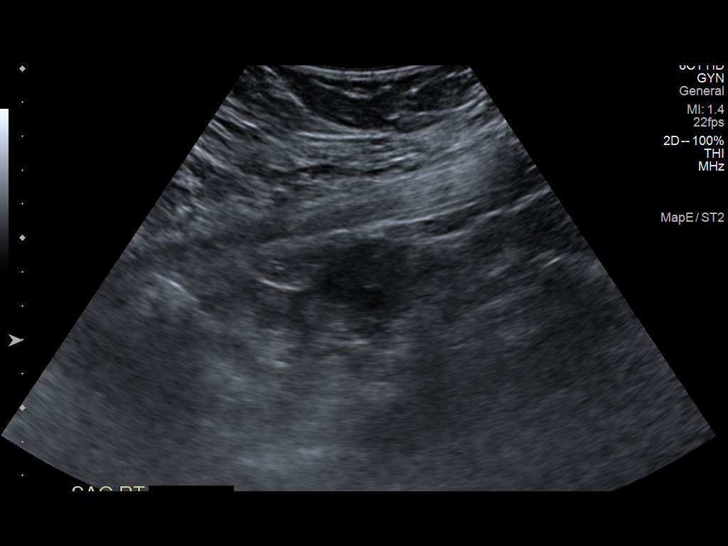
[im 120/126]
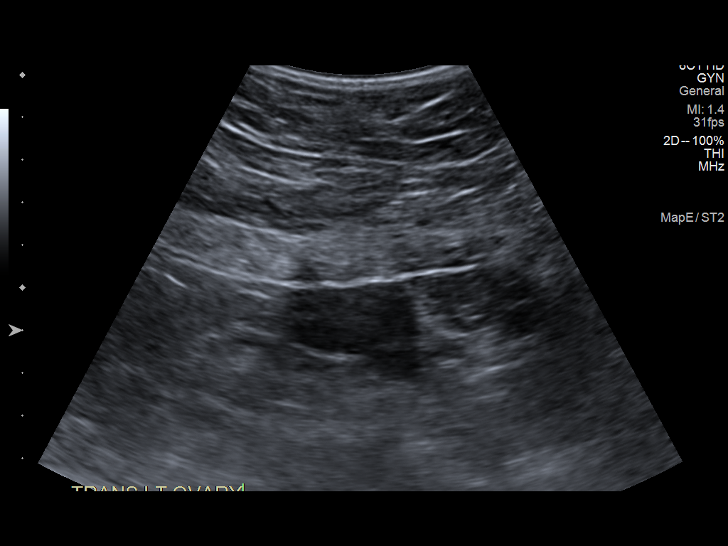

[Series 1001: us pelvis complete with transvaginal · 0.21mm/px · 1 of 2 slices shown (2 of 2)]
[im 1/2]
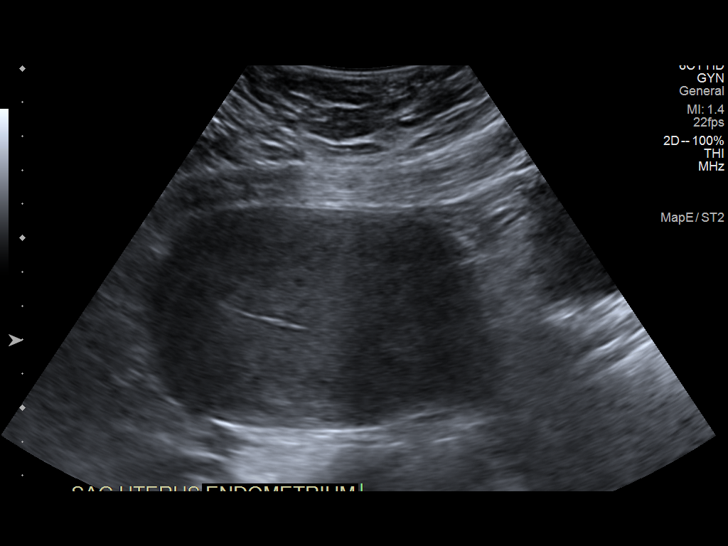

[13 of 25 positions shown; findings below may reference images not displayed]

FINDINGS: Uterus

Measurements: 11.9 x 8.8 x 8.2 cm = volume: 448 mL. Anteverted.
Heterogeneous myometrium. At least 3 leiomyomata are identified,
measuring 3.7 cm subserosal at posterior upper uterus, 3.2 cm
intramural anterior mid uterus and 3.0 cm intramural anterior upper
uterus. Nabothian cysts at cervix.

Endometrium

Thickness: 2 mm.  No endometrial fluid or mass

Right ovary

Measurements: 3.3 x 2.4 x 2.3 cm = volume: 9.4 mL. Obscured on
transvaginal imaging by bowel. Normal morphology without mass

Left ovary

Measurements: 2.9 x 2.0 x 2.9 cm = volume: 8.7 mL. Obscured on
transvaginal imaging by bowel. Normal morphology without mass

Other findings

No free pelvic fluid.  No adnexal masses.
IMPRESSION: Enlarged uterus containing at least 3 leiomyomata as above.

Remainder of exam unremarkable.
